# Patient Record
Sex: Male | Born: 1960 | Race: White | Hispanic: No | Marital: Married | Smoking: Never smoker
Health system: Southern US, Community
[De-identification: ages and names within clinical notes are randomized; demographics above are authoritative.]

## PROBLEM LIST (undated history)

## (undated) DIAGNOSIS — I10 Essential (primary) hypertension: Secondary | ICD-10-CM

## (undated) DIAGNOSIS — M545 Low back pain, unspecified: Secondary | ICD-10-CM

## (undated) DIAGNOSIS — R7303 Prediabetes: Secondary | ICD-10-CM

## (undated) DIAGNOSIS — I83899 Varicose veins of unspecified lower extremities with other complications: Secondary | ICD-10-CM

## (undated) DIAGNOSIS — E559 Vitamin D deficiency, unspecified: Secondary | ICD-10-CM

## (undated) DIAGNOSIS — E785 Hyperlipidemia, unspecified: Secondary | ICD-10-CM

## (undated) HISTORY — DX: Low back pain, unspecified: M54.50

## (undated) HISTORY — DX: Vitamin D deficiency, unspecified: E55.9

## (undated) HISTORY — DX: Prediabetes: R73.03

## (undated) HISTORY — DX: Essential (primary) hypertension: I10

## (undated) HISTORY — DX: Hyperlipidemia, unspecified: E78.5

## (undated) HISTORY — DX: Low back pain: M54.5

## (undated) HISTORY — DX: Varicose veins of unspecified lower extremity with other complications: I83.899

---

## 1999-03-01 ENCOUNTER — Encounter: Payer: Self-pay | Admitting: Family Medicine

## 1999-03-01 ENCOUNTER — Ambulatory Visit (HOSPITAL_COMMUNITY): Admission: RE | Admit: 1999-03-01 | Discharge: 1999-03-01 | Payer: Self-pay | Admitting: Family Medicine

## 2005-08-08 ENCOUNTER — Ambulatory Visit: Payer: Self-pay | Admitting: Professional

## 2005-08-22 ENCOUNTER — Ambulatory Visit: Payer: Self-pay | Admitting: Professional

## 2005-09-02 ENCOUNTER — Ambulatory Visit: Payer: Self-pay | Admitting: Professional

## 2005-09-12 ENCOUNTER — Ambulatory Visit: Payer: Self-pay | Admitting: Professional

## 2005-09-19 ENCOUNTER — Ambulatory Visit: Payer: Self-pay | Admitting: Professional

## 2005-09-26 ENCOUNTER — Ambulatory Visit: Payer: Self-pay | Admitting: Professional

## 2005-09-30 ENCOUNTER — Ambulatory Visit: Payer: Self-pay | Admitting: Professional

## 2005-10-10 ENCOUNTER — Ambulatory Visit: Payer: Self-pay | Admitting: Professional

## 2005-10-24 ENCOUNTER — Ambulatory Visit: Payer: Self-pay | Admitting: Professional

## 2005-11-07 ENCOUNTER — Ambulatory Visit: Payer: Self-pay | Admitting: Professional

## 2005-11-28 ENCOUNTER — Ambulatory Visit: Payer: Self-pay | Admitting: Professional

## 2005-12-02 ENCOUNTER — Ambulatory Visit: Payer: Self-pay | Admitting: Professional

## 2005-12-09 ENCOUNTER — Ambulatory Visit: Payer: Self-pay | Admitting: Professional

## 2005-12-16 ENCOUNTER — Ambulatory Visit: Payer: Self-pay | Admitting: Professional

## 2005-12-30 ENCOUNTER — Ambulatory Visit: Payer: Self-pay | Admitting: Professional

## 2006-01-13 ENCOUNTER — Ambulatory Visit: Payer: Self-pay | Admitting: Professional

## 2006-02-10 ENCOUNTER — Ambulatory Visit: Payer: Self-pay | Admitting: Professional

## 2006-08-04 ENCOUNTER — Ambulatory Visit: Payer: Self-pay | Admitting: Professional

## 2006-08-11 ENCOUNTER — Ambulatory Visit: Payer: Self-pay | Admitting: Professional

## 2006-08-18 ENCOUNTER — Ambulatory Visit: Payer: Self-pay | Admitting: Professional

## 2006-08-25 ENCOUNTER — Ambulatory Visit: Payer: Self-pay | Admitting: Professional

## 2006-09-01 ENCOUNTER — Ambulatory Visit: Payer: Self-pay | Admitting: Professional

## 2006-09-08 ENCOUNTER — Ambulatory Visit: Payer: Self-pay | Admitting: Professional

## 2006-09-15 ENCOUNTER — Ambulatory Visit: Payer: Self-pay | Admitting: Professional

## 2006-09-22 ENCOUNTER — Ambulatory Visit: Payer: Self-pay | Admitting: Professional

## 2006-10-06 ENCOUNTER — Ambulatory Visit: Payer: Self-pay | Admitting: Professional

## 2006-10-20 ENCOUNTER — Ambulatory Visit: Payer: Self-pay | Admitting: Professional

## 2006-11-03 ENCOUNTER — Ambulatory Visit: Payer: Self-pay | Admitting: Professional

## 2006-12-04 ENCOUNTER — Ambulatory Visit: Payer: Self-pay | Admitting: Professional

## 2016-11-29 ENCOUNTER — Encounter: Payer: Self-pay | Admitting: Vascular Surgery

## 2016-11-29 ENCOUNTER — Other Ambulatory Visit: Payer: Self-pay | Admitting: *Deleted

## 2016-11-29 DIAGNOSIS — I83892 Varicose veins of left lower extremities with other complications: Secondary | ICD-10-CM

## 2016-12-02 ENCOUNTER — Ambulatory Visit (INDEPENDENT_AMBULATORY_CARE_PROVIDER_SITE_OTHER): Payer: BC Managed Care – PPO | Admitting: Vascular Surgery

## 2016-12-02 ENCOUNTER — Encounter: Payer: Self-pay | Admitting: Vascular Surgery

## 2016-12-02 VITALS — BP 148/83 | HR 91 | Temp 97.3°F | Resp 18 | Ht 67.5 in | Wt 225.2 lb

## 2016-12-02 DIAGNOSIS — I83892 Varicose veins of left lower extremities with other complications: Secondary | ICD-10-CM | POA: Diagnosis not present

## 2016-12-02 NOTE — Progress Notes (Signed)
Subjective:     Patient ID: Adam Krueger, male   DOB: 09/27/1961, 56 y.o.   MRN: [id number]  HPI This 56 year old male was referred by Dr. Tiburcio PeaHarris for evaluation of varicose veins left ankle. Patient has had several episodes of bleeding from a small varicosity the lateral aspect of the left lower leg. This originally bled about 4 weeks ago. He has tried to treat this with compression dressings but each time he removed the bandage she does have some recurrent bleeding. He has no history of DVT thrombophlebitis distal edema or other thrombotic problems. He has no symptoms in the right leg.  Past Medical History:  Diagnosis Date  . Bleeding from varicose vein   . Borderline diabetes mellitus   . Hyperlipidemia   . Hypertension   . Lumbar pain   . Vitamin D deficiency disease     Social History  Substance Use Topics  . Smoking status: Never Smoker  . Smokeless tobacco: Never Used  . Alcohol use 1.2 oz/week    2 Cans of beer per week    History reviewed. No pertinent family history.  Allergies  Allergen Reactions  . Caffeine   . Hctz [Hydrochlorothiazide] Other (See Comments)     Increased glucose  . Lipitor [Atorvastatin] Other (See Comments)    Myalgia  . Niacin And Related Other (See Comments)    Flushing  . Norvasc [Amlodipine Besylate] Swelling  . Relafen [Nabumetone] Other (See Comments)    Eye swelling  . Statins Other (See Comments)    Myalgia   . Sulfa Antibiotics      Current Outpatient Prescriptions:  .  Cholecalciferol (VITAMIN D) 2000 units CAPS, Take 2,000 Units by mouth daily., Disp: , Rfl:  .  losartan (COZAAR) 100 MG tablet, Take 100 mg by mouth daily., Disp: , Rfl:  .  verapamil (VERELAN PM) 240 MG 24 hr capsule, Take 240 mg by mouth at bedtime., Disp: , Rfl:   Vitals:   12/02/16 1357 12/02/16 1358  BP: (!) 159/82 (!) 148/83  Pulse: 91   Resp: 18   Temp: 97.3 F (36.3 C)   TempSrc: Oral   SpO2: 97%   Weight: 225 lb 3.2 oz (102.2 kg)   Height: 5'  7.5" (1.715 m)     Body mass index is 34.75 kg/m.        Review of Systems Denies chest pain, dyspnea on exertion, PND, orthopnea, hemoptysis. Patient does have hypertension and hyperlipidemia. Other systems negative and complete review of systems    Objective:   Physical Exam BP (!) 148/83 (BP Location: Left Arm, Patient Position: Sitting, Cuff Size: Normal) Comment: recheck  Pulse 91   Temp 97.3 F (36.3 C) (Oral)   Resp 18   Ht 5' 7.5" (1.715 m)   Wt 225 lb 3.2 oz (102.2 kg)   SpO2 97%   BMI 34.75 kg/m     Gen.-alert and oriented x3 in no apparent distress HEENT normal for age Lungs no rhonchi or wheezing Cardiovascular regular rhythm no murmurs carotid pulses 3+ palpable no bruits audible Abdomen soft nontender no palpable masses Musculoskeletal free of  major deformities Skin clear -no rashes Neurologic normal Lower extremities 3+ femoral and dorsalis pedis pulses palpable bilaterally with no edema Isolated varix about 10 cm proximal to left lateral malleolus with bandage overlying this. This was slowly removed and did have some oozing from the area where the bleeding has occurred. A few smaller reticular veins proximal to this area. One small varix  over the great saphenous vein medial calf. No distal edema hyperpigmentation or ulceration noted. Right leg free of varicosities  Today I performed a bedside SonoSite ultrasound exam the left leg. There is no abnormality noted in the left great saphenous or left small saphenous veins in both are free of reflux.     Assessment:     Isolated varicosities left lateral leg with no evidence of reflux and left great or left small saphenous veins which has had recurrent bleeding Hypertension Hyperlipidemia    Plan:     Discuss situation with patient and he opted to proceed with foam sclerotherapy of bleeding site today We'll also treat him with short leg compression stockings 20-30 millimeter gradient following the  sclerotherapy session Exline all his questions were answered

## 2016-12-02 NOTE — Progress Notes (Signed)
X=.3% Sotradecol administered with a 27g butterfly.  Patient received a total of 2 cc solution (no Co2 foam).  Injected bleed site and two vessels above and below the area that bled. Easy access. Tol well. Follow prn.   Compression stockings applied: Yes.

## 2017-05-24 DIAGNOSIS — T07XXXA Unspecified multiple injuries, initial encounter: Secondary | ICD-10-CM | POA: Insufficient documentation

## 2017-05-24 DIAGNOSIS — Y9389 Activity, other specified: Secondary | ICD-10-CM | POA: Diagnosis not present

## 2017-05-24 DIAGNOSIS — R05 Cough: Secondary | ICD-10-CM | POA: Diagnosis not present

## 2017-05-24 DIAGNOSIS — I10 Essential (primary) hypertension: Secondary | ICD-10-CM | POA: Diagnosis not present

## 2017-05-24 DIAGNOSIS — W07XXXA Fall from chair, initial encounter: Secondary | ICD-10-CM | POA: Diagnosis not present

## 2017-05-24 DIAGNOSIS — Y929 Unspecified place or not applicable: Secondary | ICD-10-CM | POA: Diagnosis not present

## 2017-05-24 DIAGNOSIS — Z23 Encounter for immunization: Secondary | ICD-10-CM | POA: Insufficient documentation

## 2017-05-24 DIAGNOSIS — Z79899 Other long term (current) drug therapy: Secondary | ICD-10-CM | POA: Diagnosis not present

## 2017-05-24 DIAGNOSIS — R55 Syncope and collapse: Secondary | ICD-10-CM | POA: Diagnosis not present

## 2017-05-24 DIAGNOSIS — S0990XA Unspecified injury of head, initial encounter: Secondary | ICD-10-CM | POA: Diagnosis present

## 2017-05-24 DIAGNOSIS — S0181XA Laceration without foreign body of other part of head, initial encounter: Secondary | ICD-10-CM | POA: Insufficient documentation

## 2017-05-24 DIAGNOSIS — Y999 Unspecified external cause status: Secondary | ICD-10-CM | POA: Diagnosis not present

## 2017-05-24 NOTE — ED Triage Notes (Signed)
Pt states that he had a coughing fit that caused him to pass out this evening on his driveway. He woke up on the ground bleeding. Laceration noted to R side of forehead. Additional small abrasions surrounding. Pt also states that he bit his tongue and thinks that he broke a tooth (R molar). Pt A&Ox4 in triage. Pupils equal and reactive in triage. Bleeding controlled at this time.

## 2017-05-25 ENCOUNTER — Emergency Department (HOSPITAL_COMMUNITY)
Admission: EM | Admit: 2017-05-25 | Discharge: 2017-05-25 | Disposition: A | Discharge status: Home / Self Care | Payer: BC Managed Care – PPO | Attending: Emergency Medicine | Admitting: Emergency Medicine

## 2017-05-25 ENCOUNTER — Emergency Department (HOSPITAL_COMMUNITY): Payer: BC Managed Care – PPO

## 2017-05-25 ENCOUNTER — Encounter (HOSPITAL_COMMUNITY): Payer: Self-pay

## 2017-05-25 DIAGNOSIS — T07XXXA Unspecified multiple injuries, initial encounter: Secondary | ICD-10-CM

## 2017-05-25 DIAGNOSIS — R05 Cough: Secondary | ICD-10-CM

## 2017-05-25 DIAGNOSIS — S0181XA Laceration without foreign body of other part of head, initial encounter: Secondary | ICD-10-CM

## 2017-05-25 DIAGNOSIS — R55 Syncope and collapse: Secondary | ICD-10-CM

## 2017-05-25 MED ORDER — ALBUTEROL SULFATE HFA 108 (90 BASE) MCG/ACT IN AERS
2.0000 | INHALATION_SPRAY | RESPIRATORY_TRACT | Status: DC | PRN
  Filled 2017-05-25: qty 6.7

## 2017-05-25 MED ORDER — BENZONATATE 100 MG PO CAPS: 100.0000 mg | ORAL_CAPSULE | Freq: Three times a day (TID) | ORAL | 0 refills | Status: AC

## 2017-05-25 MED ORDER — LIDOCAINE-EPINEPHRINE (PF) 2 %-1:200000 IJ SOLN
10.0000 mL | Freq: Once | INTRAMUSCULAR | Status: AC
  Administered 2017-05-25: 10 mL
  Filled 2017-05-25: qty 20

## 2017-05-25 MED ORDER — BENZONATATE 100 MG PO CAPS
100.0000 mg | ORAL_CAPSULE | Freq: Once | ORAL | Status: AC
Start: 2017-05-25 — End: 2017-05-25
  Administered 2017-05-25: 100 mg via ORAL
  Filled 2017-05-25: qty 1

## 2017-05-25 MED ORDER — TETANUS-DIPHTH-ACELL PERTUSSIS 5-2.5-18.5 LF-MCG/0.5 IM SUSP
0.5000 mL | Freq: Once | INTRAMUSCULAR | Status: AC
  Administered 2017-05-25: 0.5 mL via INTRAMUSCULAR
  Filled 2017-05-25: qty 0.5

## 2017-05-25 NOTE — Discharge Instructions (Signed)
Take medications for cough for better control and to avoid another syncopal episode. Follow up with Dr. Tiburcio PeaHarris for suture removal in 4-5 days.

## 2017-05-25 NOTE — ED Provider Notes (Signed)
[facility]-EMERGENCY DEPT Provider Note   CSN: [id number] Arrival date & time: 05/24/17  2339     History   Chief Complaint Chief Complaint  Patient presents with  . Loss of Consciousness  . Head Injury    HPI Adam Krueger is a 56 y.o. male.  Patient presents after syncopal episode during which he fell from a chair causing abrasions and laceration to his face and hands. He reports he has had a cough for about one week and went into a coughing episode that caused him to pass out. This has happened in the past. No chest pain, lightheadedness, dizziness or SOB prior to episode or since. No vomiting. He denies fever associated with the cough.    The history is provided by the patient and the spouse. No language interpreter was used.  Loss of Consciousness   Pertinent negatives include abdominal pain, chest pain, dizziness, fever, headaches, light-headedness, nausea, vomiting and weakness.  Head Injury   Pertinent negatives include no vomiting and no weakness.    Past Medical History:  Diagnosis Date  . Bleeding from varicose vein   . Borderline diabetes mellitus   . Hyperlipidemia   . Hypertension   . Lumbar pain   . Vitamin D deficiency disease     There are no active problems to display for this patient.   History reviewed. No pertinent surgical history.     Home Medications    Prior to Admission medications   Medication Sig Start Date End Date Taking? Authorizing Provider  acetaminophen (TYLENOL) 500 MG tablet Take 1,000 mg by mouth daily after breakfast.   Yes [provider]  albuterol (PROVENTIL HFA;VENTOLIN HFA) 108 (90 Base) MCG/ACT inhaler Inhale 1-2 puffs into the lungs every 6 (six) hours as needed for wheezing or shortness of breath.   Yes [provider]  Cholecalciferol (VITAMIN D) 2000 units CAPS Take 4,000 Units by mouth daily after breakfast.    Yes [provider]  losartan (COZAAR) 100 MG tablet Take 100 mg by mouth daily after  breakfast.    Yes [provider]  pyridOXINE (VITAMIN B-6) 100 MG tablet Take 100 mg by mouth daily after breakfast.   Yes [provider]  verapamil (VERELAN PM) 240 MG 24 hr capsule Take 240 mg by mouth 2 (two) times daily.    Yes [provider]  vitamin B-12 (CYANOCOBALAMIN) 1000 MCG tablet Take 1,000 mcg by mouth daily after breakfast.   Yes [provider]    Family History History reviewed. No pertinent family history.  Social History Social History  Substance Use Topics  . Smoking status: Never Smoker  . Smokeless tobacco: Never Used  . Alcohol use 1.2 oz/week    2 Cans of beer per week     Allergies   Caffeine; Hctz [hydrochlorothiazide]; Lipitor [atorvastatin]; Niacin and related; Norvasc [amlodipine besylate]; Relafen [nabumetone]; Statins; and Sulfa antibiotics   Review of Systems Review of Systems  Constitutional: Negative for chills and fever.  Respiratory: Positive for cough. Negative for shortness of breath.   Cardiovascular: Positive for syncope. Negative for chest pain.  Gastrointestinal: Negative.  Negative for abdominal pain, nausea and vomiting.  Musculoskeletal: Negative.  Negative for myalgias.  Skin: Positive for wound.  Neurological: Positive for syncope. Negative for dizziness, speech difficulty, weakness, light-headedness and headaches.     Physical Exam Updated Vital Signs BP (!) 175/95   Pulse (!) 110   Temp 98.4 F (36.9 C) (Oral)   Resp (!) 22  SpO2 96%   Physical Exam  Constitutional: He is oriented to person, place, and time. He appears well-developed and well-nourished.  HENT:  Head: Normocephalic.  Evidence tongue biting to tip of tongue. No through and through lacerations. No malocclusion or mandibular tenderness. No other facial bony tenderness. No hemotympanum.   Neck: Normal range of motion. Neck supple.  Cardiovascular: Normal rate and regular rhythm.   Pulmonary/Chest: Effort normal and  breath sounds normal. He has no wheezes. He has no rales. He exhibits no tenderness.  Abdominal: Soft. Bowel sounds are normal. There is no tenderness. There is no rebound and no guarding.  Musculoskeletal: Normal range of motion.  No midline cervical tenderness. Moves all extremities without limitation.  Neurological: He is alert and oriented to person, place, and time.  CN's 3-12 grossly intact. Speech is clear and focused. No facial asymmetry. No lateralizing weakness.No deficits of coordination. Ambulatory without imbalance.    Skin: Skin is warm and dry. No rash noted.  1.5 cm jagged laceration to right forehead central to abrasion. Multiple abrasions to left forehead and bilateral cheek prominences. There are multiple superficial abrasions to hands bilaterally.   Psychiatric: He has a normal mood and affect.     ED Treatments / Results  Labs (all labs ordered are listed, but only abnormal results are displayed) Labs Reviewed  CBG MONITORING, ED    EKG  EKG Interpretation  Date/Time:  Sunday May 25 2017 00:01:36 EDT Ventricular Rate:  102 PR Interval:    QRS Duration: 97 QT Interval:  356 QTC Calculation: 464 R Axis:   69 Text Interpretation:  Sinus tachycardia Borderline low voltage, extremity leads Abnormal R-wave progression, late transition No previous ECGs available Confirmed by Zadie RhineWickline, Donald ([id number]) on 05/25/2017 12:19:55 AM       Radiology Ct Head Wo Contrast  Result Date: 05/25/2017 CLINICAL DATA:  56 y/o M; loss of consciousness with laceration to the right-sided forehead. EXAM: CT HEAD WITHOUT CONTRAST TECHNIQUE: Contiguous axial images were obtained from the base of the skull through the vertex without intravenous contrast. COMPARISON:  None. FINDINGS: Brain: No evidence of acute infarction, hemorrhage, hydrocephalus, extra-axial collection or mass lesion/mass effect. Partially empty sella turcica incidentally noted. Vascular: Mild calcific atherosclerosis of  the carotid siphons. Skull: Right frontal scalp skin laceration and small contusion. No displaced calvarial fracture. Sinuses/Orbits: No acute finding. Other: None. IMPRESSION: 1. Right frontal scalp skin laceration and small contusion. No displaced calvarial fracture. 2. No acute intracranial abnormality.  Unremarkable CT of the brain. Electronically Signed   By: Mitzi HansenLance  Furusawa-Stratton M.D.   On: 05/25/2017 02:09    Procedures Procedures (including critical care time) LACERATION REPAIR Performed by: Elpidio AnisUPSTILL, Kaya Klausing A Authorized by: Elpidio AnisUPSTILL, Lelani Garnett A Consent: Verbal consent obtained. Risks and benefits: risks, benefits and alternatives were discussed Consent given by: patient Patient identity confirmed: provided demographic data Prepped and Draped in normal sterile fashion Wound explored  Laceration Location: right forehead  Laceration Length: 3 cm, jagged  No Foreign Bodies seen or palpated  Anesthesia: local infiltration  Local anesthetic: lidocaine 2% w/epinephrine  Anesthetic total: 2 ml  Irrigation method: syringe Amount of cleaning: standard  Skin closure: 6-0 prolene  Number of sutures: 5  Technique: simple interrupted  Patient tolerance: Patient tolerated the procedure well with no immediate complications.  Medications Ordered in ED Medications  benzonatate (TESSALON) capsule 100 mg (not administered)  albuterol (PROVENTIL HFA;VENTOLIN HFA) 108 (90 Base) MCG/ACT inhaler 2 puff (not administered)  lidocaine-EPINEPHrine (XYLOCAINE W/EPI) 2 %-1:200000 (  PF) injection 10 mL (10 mLs Infiltration Given by Other 05/25/17 0203)  Tdap (BOOSTRIX) injection 0.5 mL (0.5 mLs Intramuscular Given 05/25/17 0159)     Initial Impression / Assessment and Plan / ED Course  I have reviewed the triage vital signs and the nursing notes.  Pertinent labs & imaging results that were available during my care of the patient were reviewed by me and considered in my medical decision making  (see chart for details).     Patient in for evaluation after evidence of cough syncope. He has multiple abrasions and a suturable facial laceration, repaired as per above note.   He has a CT that is negative for intracranial injury. He is awake and alert. No further syncope, mild coughing. Ambulatory in the department without difficulty.  He is felt stable for discharge home. Return precautions discussed.   Final Clinical Impressions(s) / ED Diagnoses   Final diagnoses:  None   1. Cough syncope 2. Multiple abrasions 3. Facial laceration  New Prescriptions New Prescriptions   No medications on file     Elpidio Anis, Cordelia Poche 05/25/17 4540    Zadie Rhine, MD 05/25/17 2326

## 2018-12-02 IMAGING — CT CT HEAD W/O CM
3 of 4 series · 15 of 47 positions shown, 18 images · non-contrast
Comparison: None.

CLINICAL DATA: 56 y/o M; loss of consciousness with laceration to
the right-sided forehead.

EXAM:
CT HEAD WITHOUT CONTRAST
TECHNIQUE: Contiguous axial images were obtained from the base of the skull
through the vertex without intravenous contrast.

[Series 2: head w/o · axial · non-contrast · 0.45mm/px · z∈[-161,-41]mm · 9 of 31 slices shown, 12 images]
[im 4/31  brain]
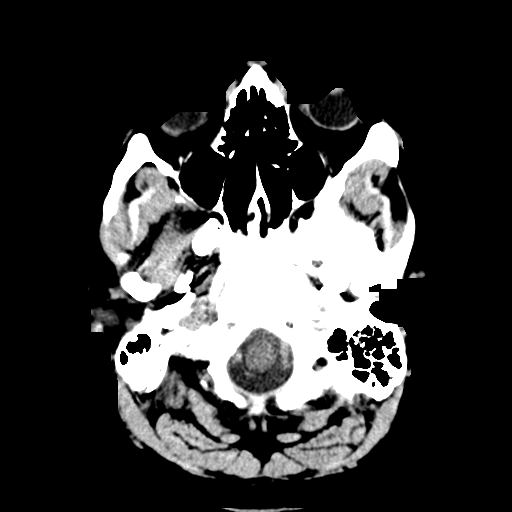
[im 4/31  bone]
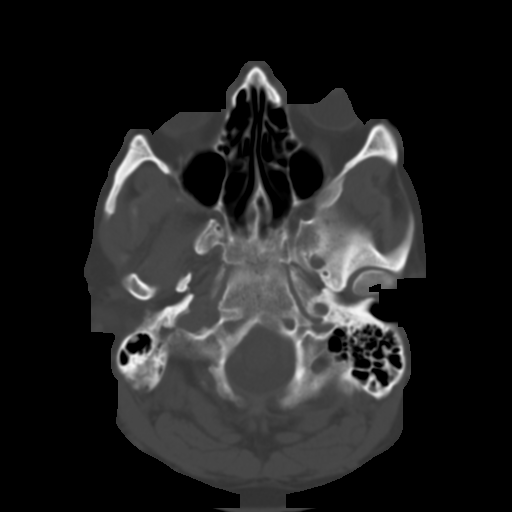
[im 7/31  brain]
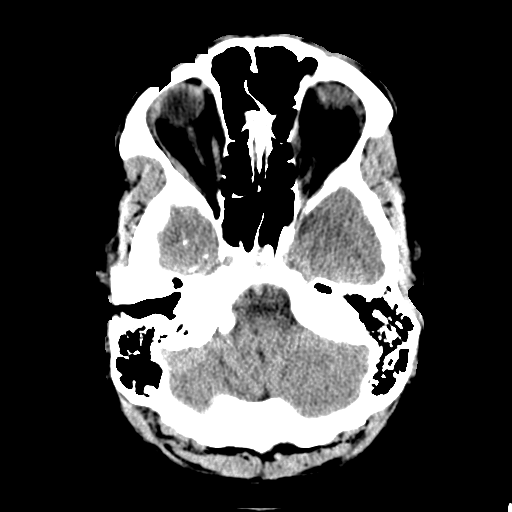
[im 10/31  brain]
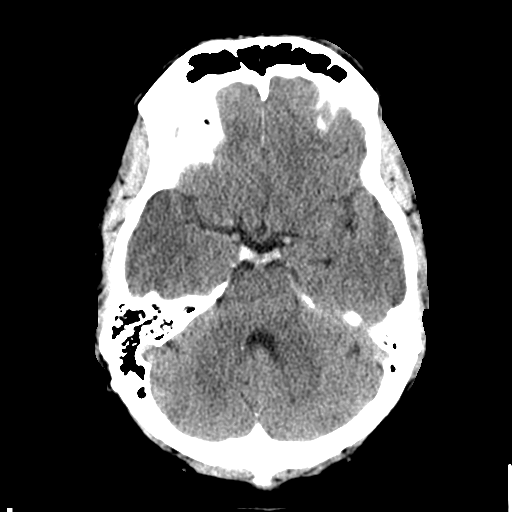
[im 13/31  brain]
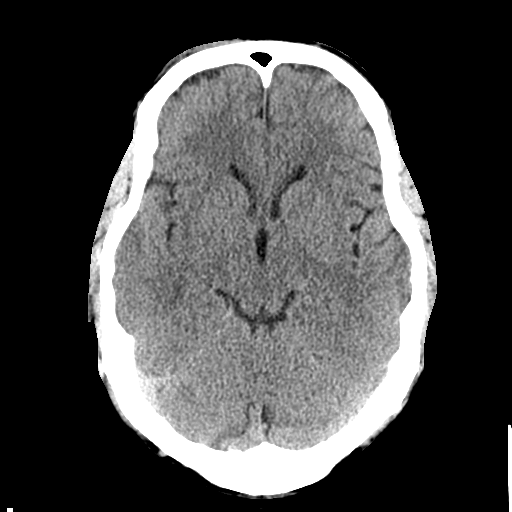
[im 16/31  brain]
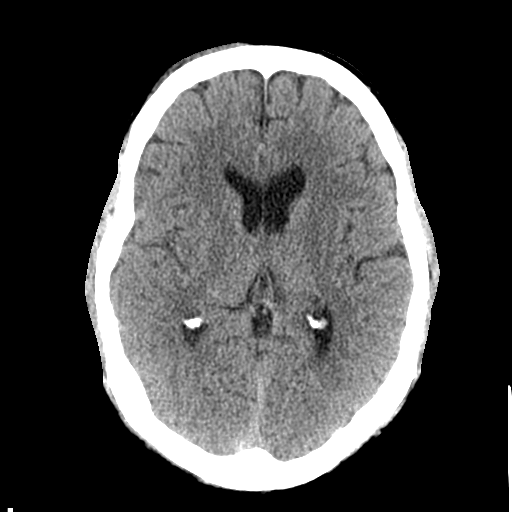
[im 16/31  bone]
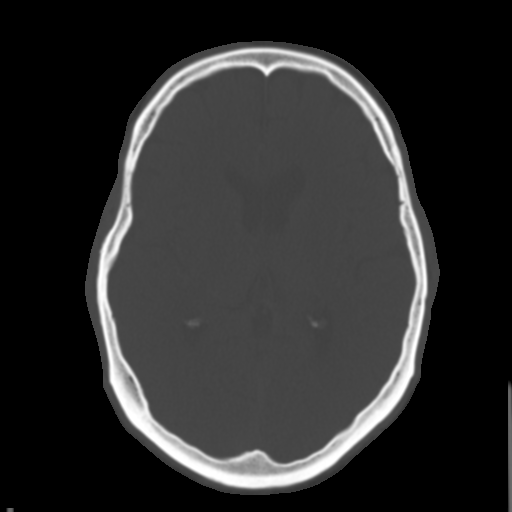
[im 19/31  brain]
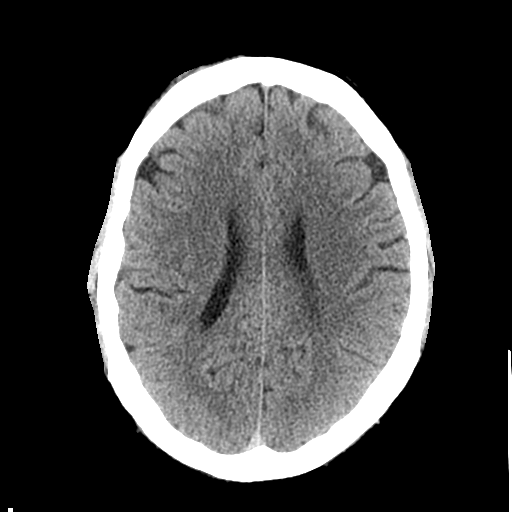
[im 22/31  brain]
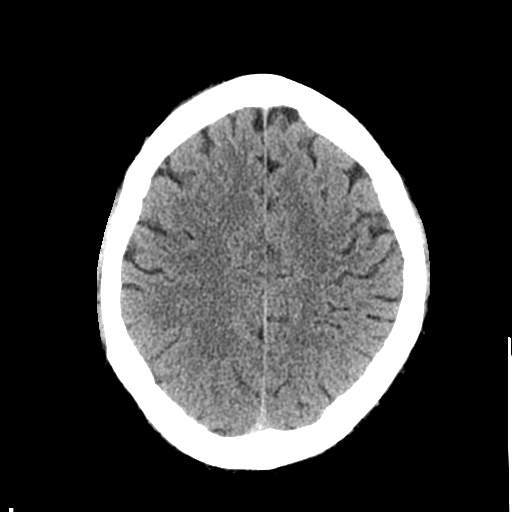
[im 25/31  brain]
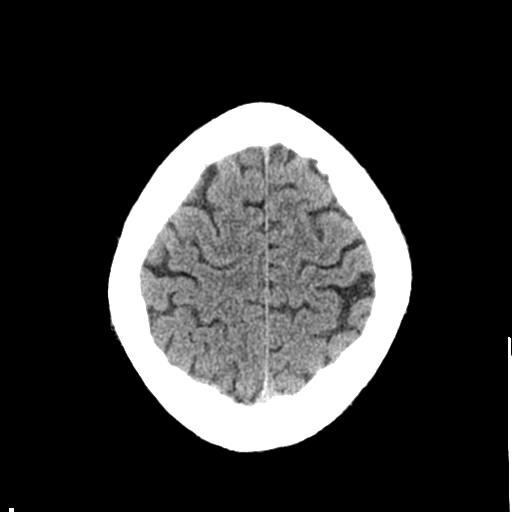
[im 28/31  brain]
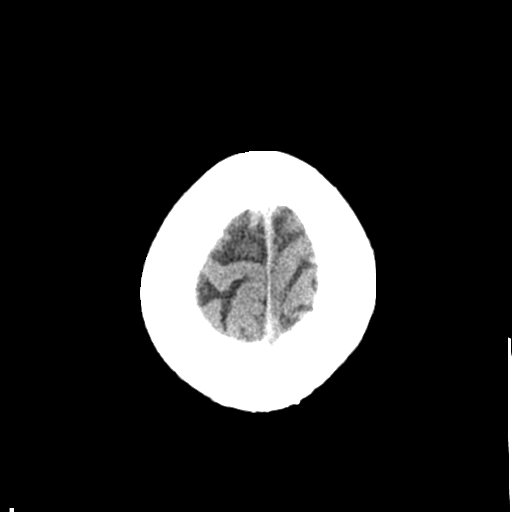
[im 28/31  bone]
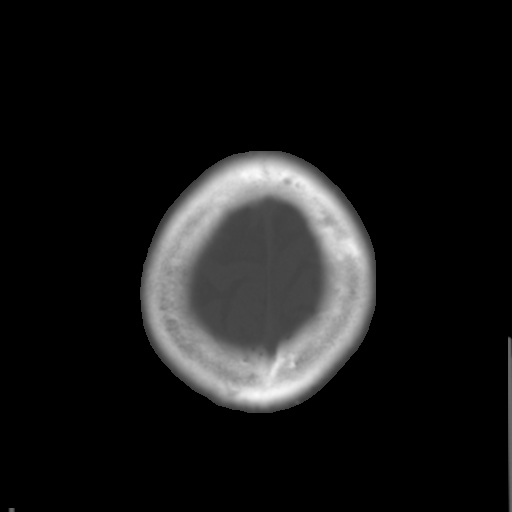

[Series 5: coronal · coronal · 0.31mm/px · 3 of 66 slices shown]
[im 22/66  brain]
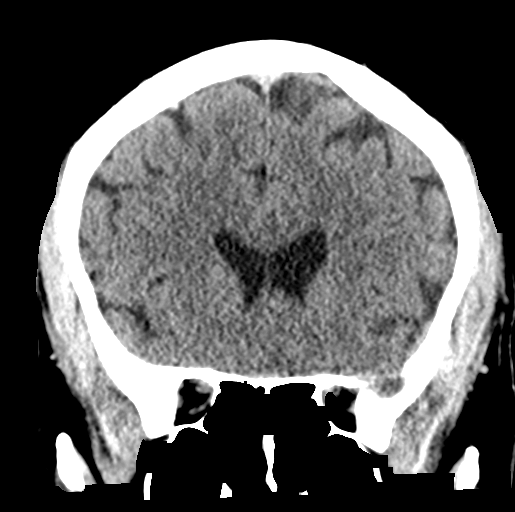
[im 29/66  brain]
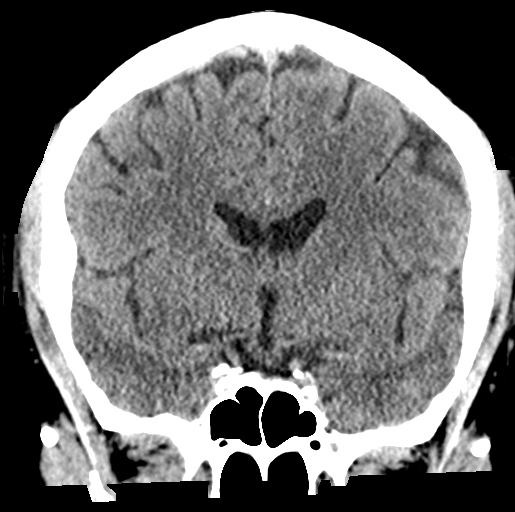
[im 37/66  brain]
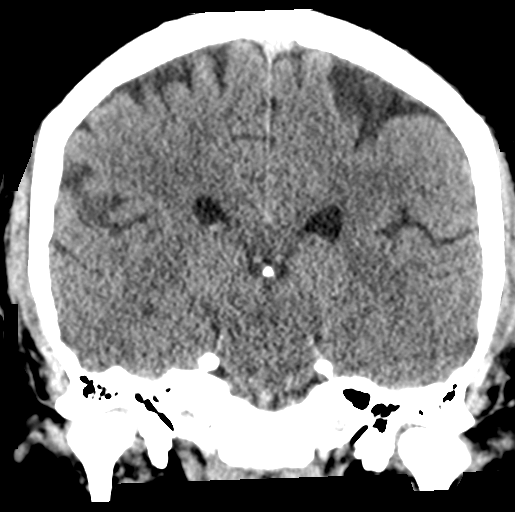

[Series 6: sagittal · sagittal · 0.31mm/px · 3 of 51 slices shown]
[im 17/51  brain]
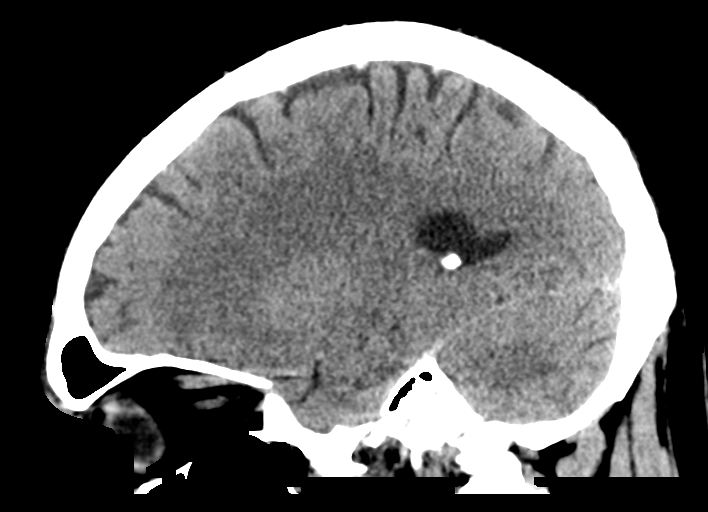
[im 26/51  brain]
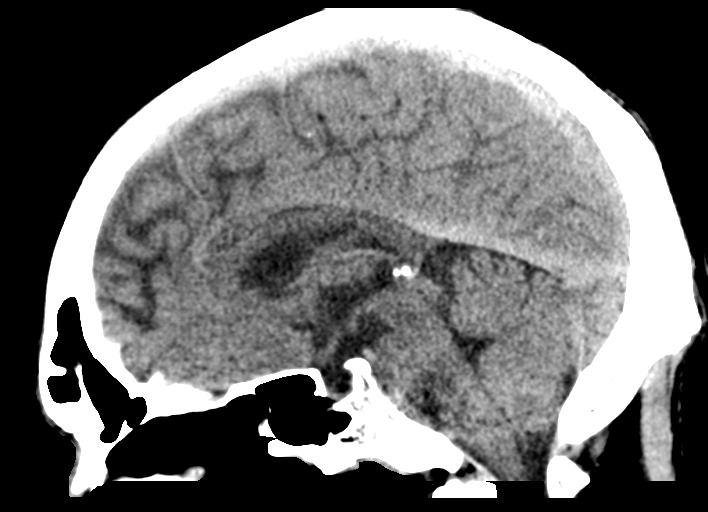
[im 34/51  brain]
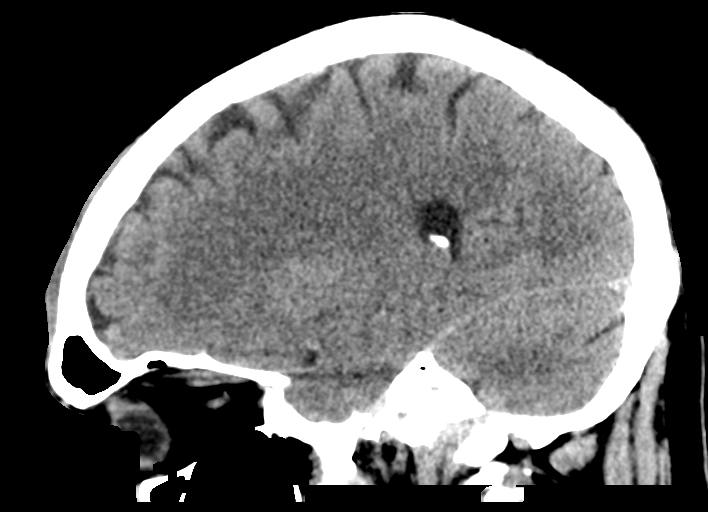

[15 of 47 positions shown; findings below may reference images not displayed]

FINDINGS: Brain: No evidence of acute infarction, hemorrhage, hydrocephalus,
extra-axial collection or mass lesion/mass effect. Partially empty
sella turcica incidentally noted.

Vascular: Mild calcific atherosclerosis of the carotid siphons.

Skull: Right frontal scalp skin laceration and small contusion. No
displaced calvarial fracture.

Sinuses/Orbits: No acute finding.

Other: None.
IMPRESSION: 1. Right frontal scalp skin laceration and small contusion. No
displaced calvarial fracture.
2. No acute intracranial abnormality.  Unremarkable CT of the brain.

By: Maritza Elena Chiang M.D.

## 2020-02-03 ENCOUNTER — Ambulatory Visit: Payer: BC Managed Care – PPO
